# Patient Record
Sex: Female | Born: 1955 | Race: White | Hispanic: No | Smoking: Current every day smoker
Health system: Southern US, Community
[De-identification: ages and names within clinical notes are randomized; demographics above are authoritative.]

## PROBLEM LIST (undated history)

## (undated) DIAGNOSIS — J449 Chronic obstructive pulmonary disease, unspecified: Secondary | ICD-10-CM

## (undated) DIAGNOSIS — I639 Cerebral infarction, unspecified: Secondary | ICD-10-CM

## (undated) DIAGNOSIS — I10 Essential (primary) hypertension: Secondary | ICD-10-CM

## (undated) HISTORY — PX: TOTAL HIP ARTHROPLASTY: SHX124

## (undated) HISTORY — PX: OTHER SURGICAL HISTORY: SHX169

## (undated) HISTORY — PX: CHOLECYSTECTOMY: SHX55

## (undated) HISTORY — PX: ABDOMINAL HYSTERECTOMY: SHX81

---

## 2000-11-16 ENCOUNTER — Encounter: Payer: Self-pay | Admitting: Emergency Medicine

## 2000-11-16 ENCOUNTER — Emergency Department (HOSPITAL_COMMUNITY): Admission: EM | Admit: 2000-11-16 | Discharge: 2000-11-16 | Payer: Self-pay | Admitting: Emergency Medicine

## 2004-09-10 ENCOUNTER — Ambulatory Visit: Payer: Self-pay | Admitting: Family Medicine

## 2004-11-17 ENCOUNTER — Emergency Department: Payer: Self-pay | Admitting: Internal Medicine

## 2005-02-06 ENCOUNTER — Other Ambulatory Visit: Payer: Self-pay

## 2005-02-06 ENCOUNTER — Emergency Department: Payer: Self-pay | Admitting: Emergency Medicine

## 2006-01-07 ENCOUNTER — Ambulatory Visit: Payer: Self-pay | Admitting: Family Medicine

## 2006-03-31 ENCOUNTER — Ambulatory Visit: Payer: Self-pay | Admitting: Gastroenterology

## 2008-03-16 ENCOUNTER — Ambulatory Visit: Payer: Self-pay | Admitting: Family Medicine

## 2009-01-26 ENCOUNTER — Emergency Department: Payer: Self-pay | Admitting: Emergency Medicine

## 2009-02-02 ENCOUNTER — Ambulatory Visit: Payer: Self-pay | Admitting: Family Medicine

## 2009-03-19 ENCOUNTER — Ambulatory Visit: Payer: Self-pay | Admitting: Internal Medicine

## 2009-03-30 ENCOUNTER — Ambulatory Visit: Payer: Self-pay | Admitting: Anesthesiology

## 2009-04-12 ENCOUNTER — Ambulatory Visit: Payer: Self-pay | Admitting: Anesthesiology

## 2009-05-07 ENCOUNTER — Ambulatory Visit: Payer: Self-pay | Admitting: Cardiology

## 2009-05-07 ENCOUNTER — Ambulatory Visit: Payer: Self-pay | Admitting: Orthopedic Surgery

## 2009-05-14 ENCOUNTER — Inpatient Hospital Stay: Payer: Self-pay | Admitting: Orthopedic Surgery

## 2009-06-23 ENCOUNTER — Inpatient Hospital Stay: Payer: Self-pay | Admitting: Internal Medicine

## 2009-07-19 ENCOUNTER — Ambulatory Visit: Payer: Self-pay | Admitting: Internal Medicine

## 2009-07-23 ENCOUNTER — Ambulatory Visit: Payer: Self-pay | Admitting: Internal Medicine

## 2009-08-06 ENCOUNTER — Ambulatory Visit: Payer: Self-pay | Admitting: Vascular Surgery

## 2009-08-10 ENCOUNTER — Ambulatory Visit: Payer: Self-pay | Admitting: Vascular Surgery

## 2009-10-15 ENCOUNTER — Ambulatory Visit: Payer: Self-pay | Admitting: Orthopedic Surgery

## 2010-02-13 ENCOUNTER — Ambulatory Visit: Payer: Self-pay | Admitting: Internal Medicine

## 2010-02-26 ENCOUNTER — Ambulatory Visit: Payer: Self-pay | Admitting: Vascular Surgery

## 2010-02-27 ENCOUNTER — Ambulatory Visit: Payer: Self-pay | Admitting: Oncology

## 2010-03-09 ENCOUNTER — Inpatient Hospital Stay: Payer: Self-pay | Admitting: Specialist

## 2010-03-29 ENCOUNTER — Ambulatory Visit: Payer: Self-pay | Admitting: Oncology

## 2010-05-02 IMAGING — US US EXTREM LOW VENOUS BILAT
1 series · 17 of 24 positions shown · non-contrast
Comparison: none

REASON FOR EXAM: Bila Legs Pain Swelling Eval DVt Call Report [id number]
COMMENTS:

[Series 1: us extrem low venous bilat · 17 of 43 slices shown]
[im 1/43]
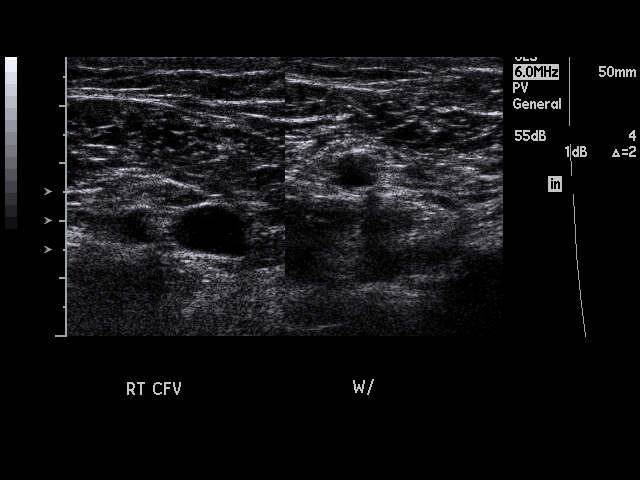
[im 4/43]
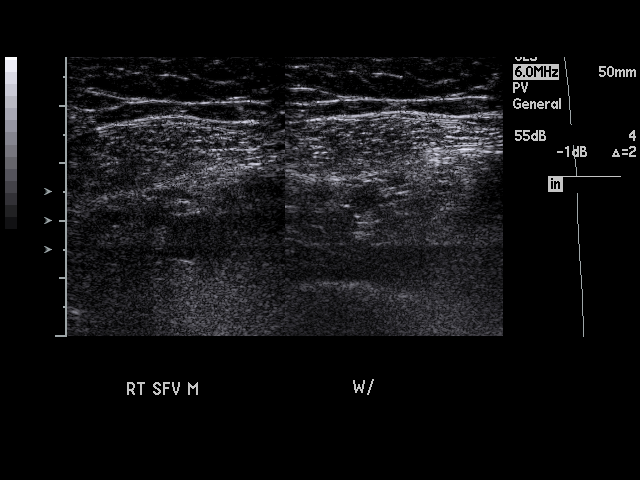
[im 6/43]
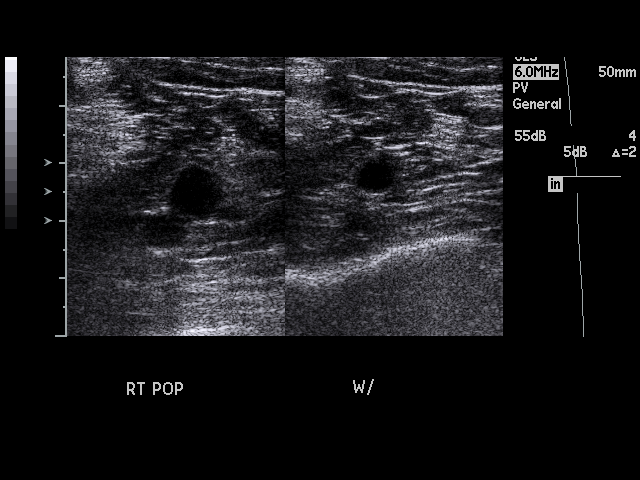
[im 8/43]
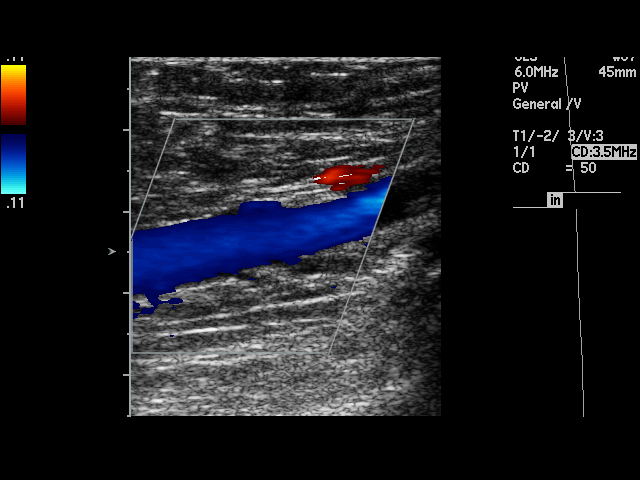
[im 11/43]
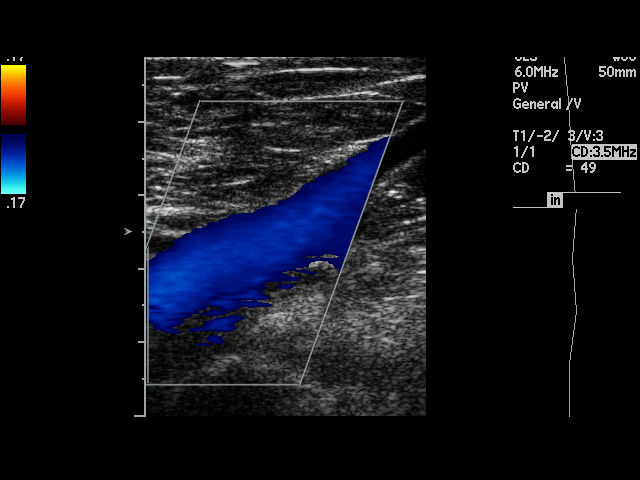
[im 13/43]
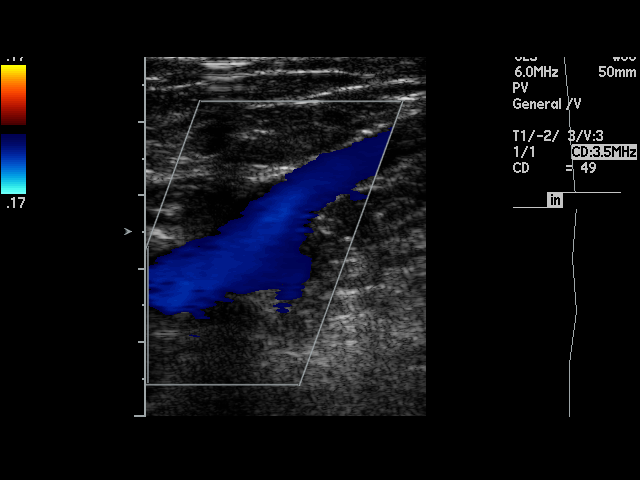
[im 17/43]
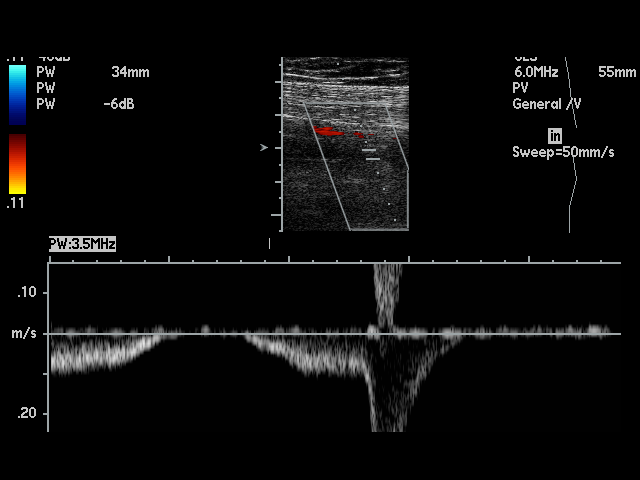
[im 19/43]
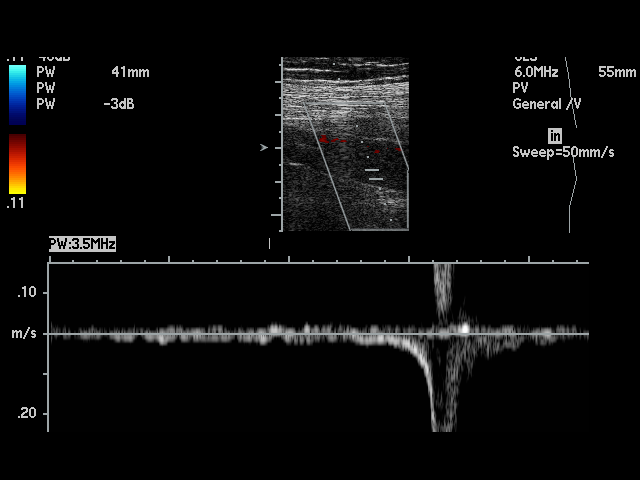
[im 22/43]
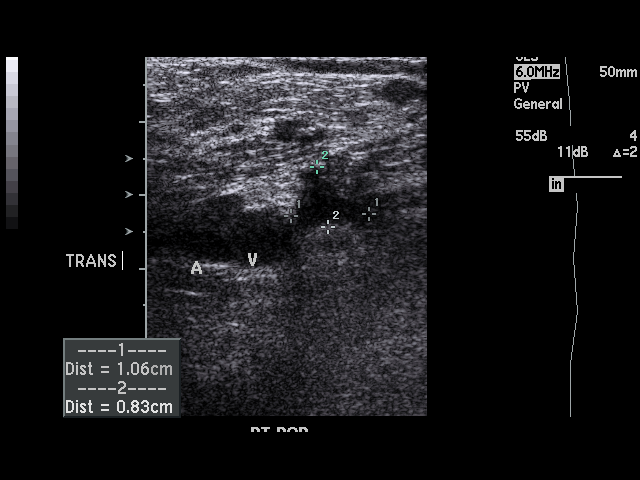
[im 24/43]
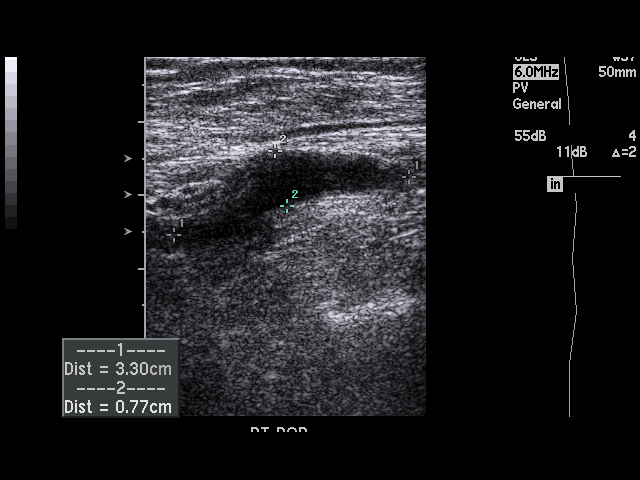
[im 26/43]
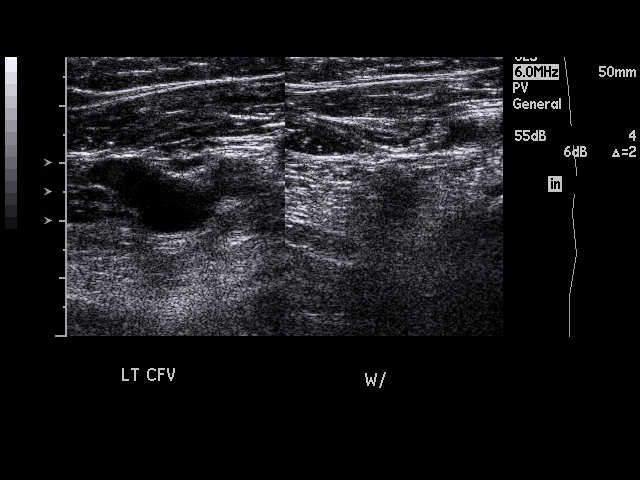
[im 30/43]
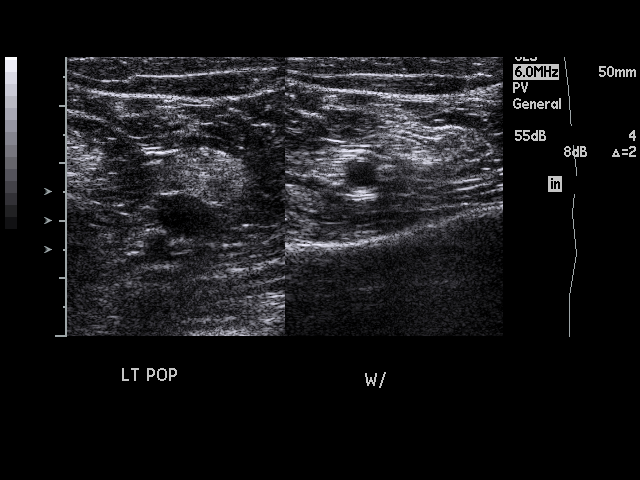
[im 32/43]
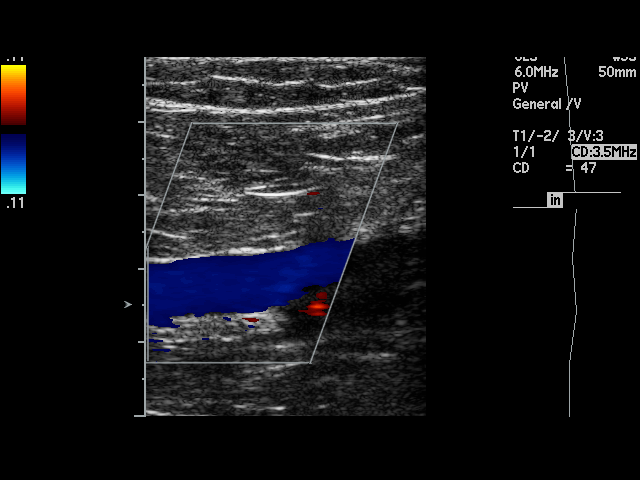
[im 35/43]
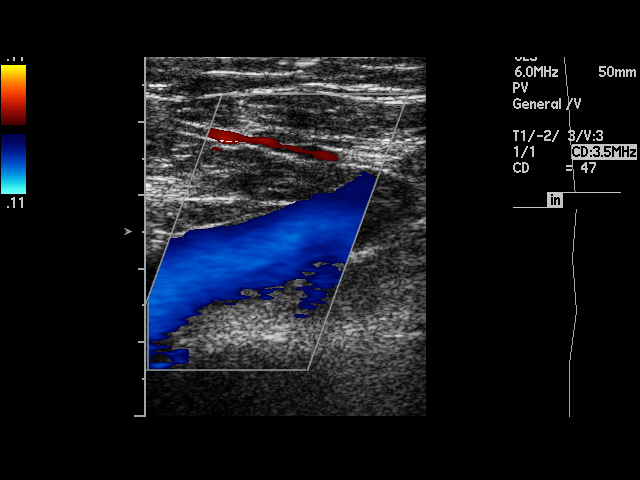
[im 37/43]
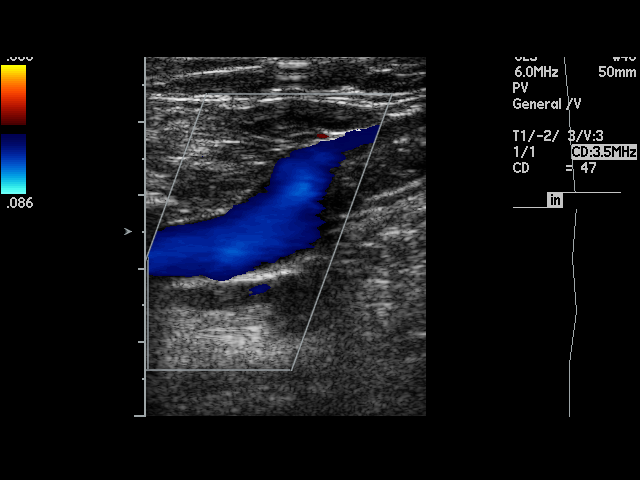
[im 39/43]
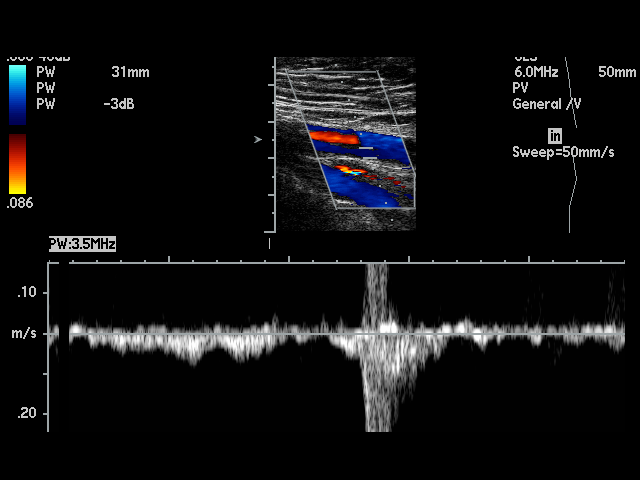
[im 43/43]
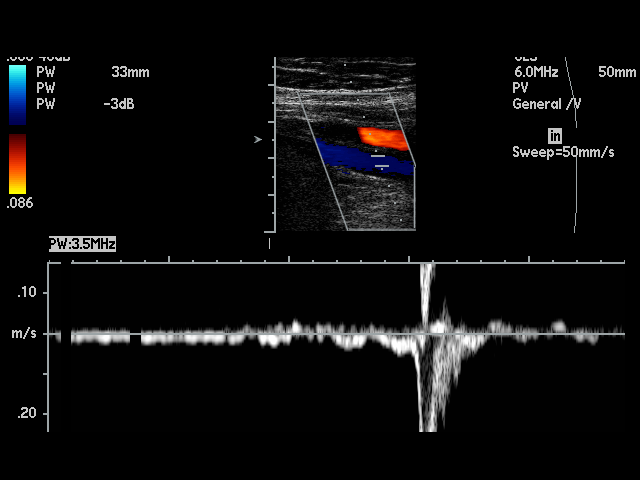

[17 of 24 positions shown; findings below may reference images not displayed]

PROCEDURE:     US  - US DOPPLER LOW EXTR BILATERAL  - July 19, 2009  [DATE]

RESULT:     The augmentation and flow waveforms are normal in appearance
bilaterally. The femoral and popliteal veins bilaterally show complete
compressibility throughout their course. Bilateral Doppler examination shows
no occlusion or evidence of deep vein thrombosis. At the left calf, there is
a fluid collection that measures 3.3 cm x 1.06 cm x 0.83 cm. The finding is
smaller than was evident on the exam of 06/27/2009.
IMPRESSION: 1.  No deep vein thrombosis is identified on either side.
2.  The previously noted fluid collection posteriorly at the left knee is
smaller than was evident on the prior exam.

## 2010-09-11 ENCOUNTER — Ambulatory Visit: Payer: Self-pay

## 2010-12-21 IMAGING — CT CT CHEST W/ CM
2 series · 16 of 32 positions shown, 20 images · non-contrast
Comparison: none

REASON FOR EXAM: sob
COMMENTS:

PROCEDURE:     CT  - CT CHEST (FOR PE) W  - March 09, 2010  [DATE]
RESULT:     History: Shorts of breath.
Comparison Study: Prior chest CT of 07/23/2009.

[Series 4: soft tissue · axial · 0.74mm/px · z∈[-547,-499]mm · 2 of 102 slices shown]
[im 8/102  mediastinal]
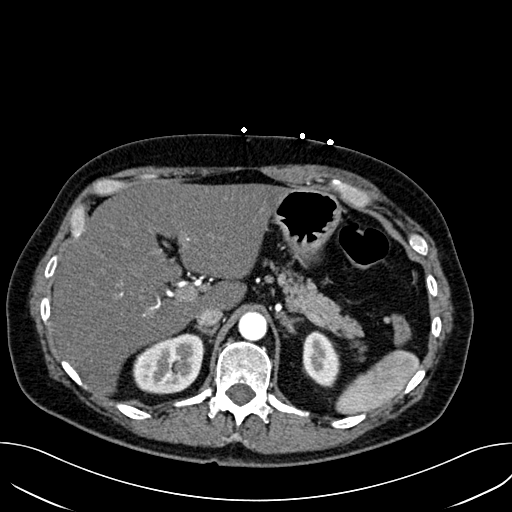
[im 24/102  mediastinal]
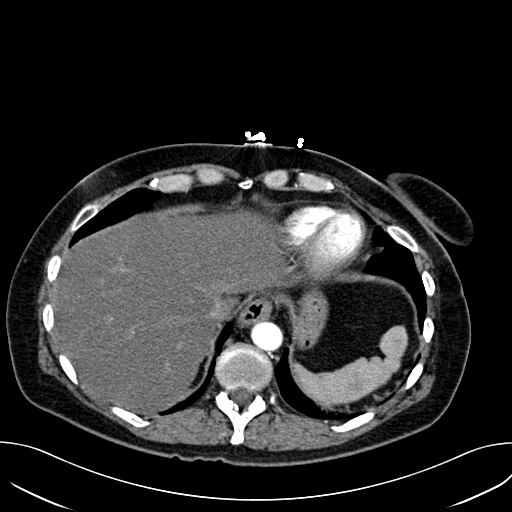

[Series 5: lung windows · axial · 0.74mm/px · z∈[-541,-289]mm · 14 of 100 slices shown, 18 images]
[im 8/100  mediastinal]
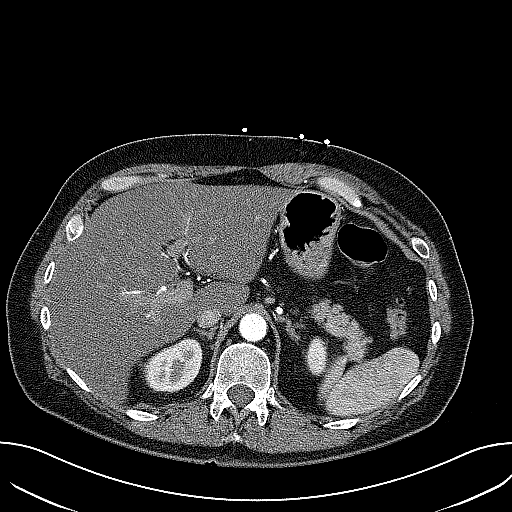
[im 8/100  lung]
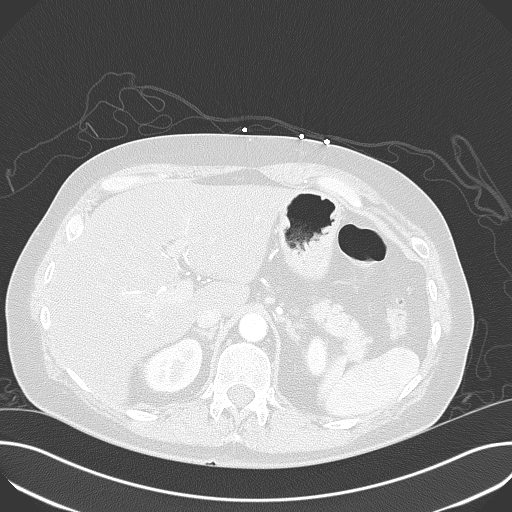
[im 16/100  lung]
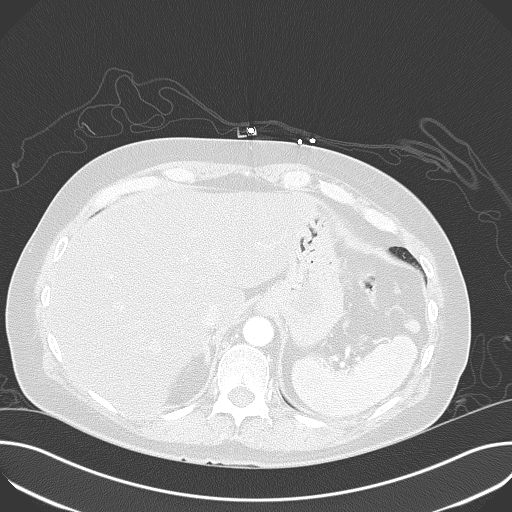
[im 23/100  lung]
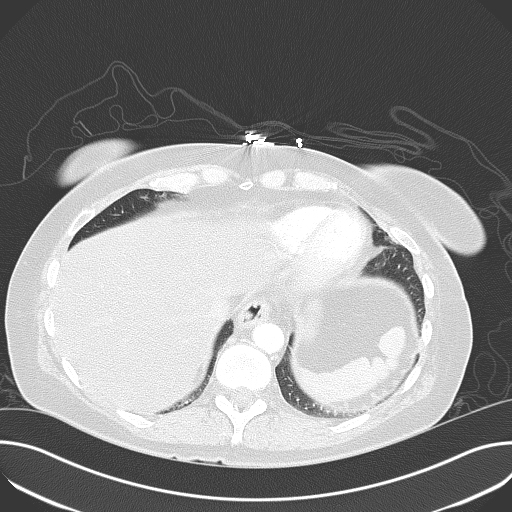
[im 31/100  lung]
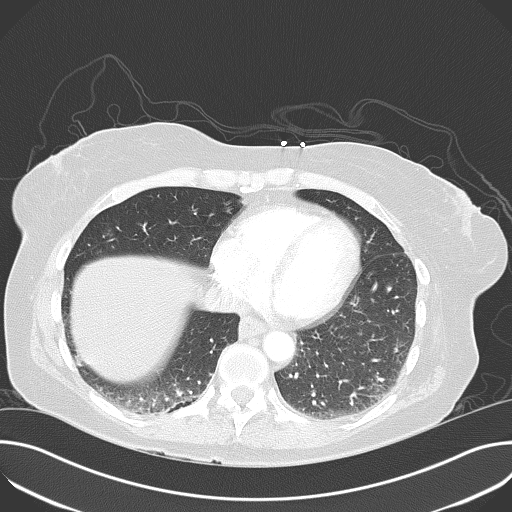
[im 39/100  mediastinal]
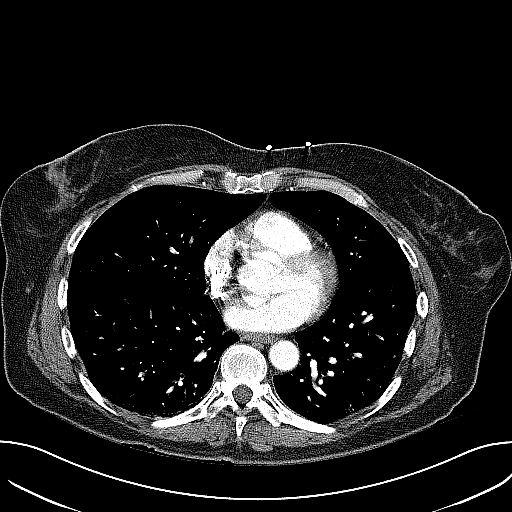
[im 39/100  lung]
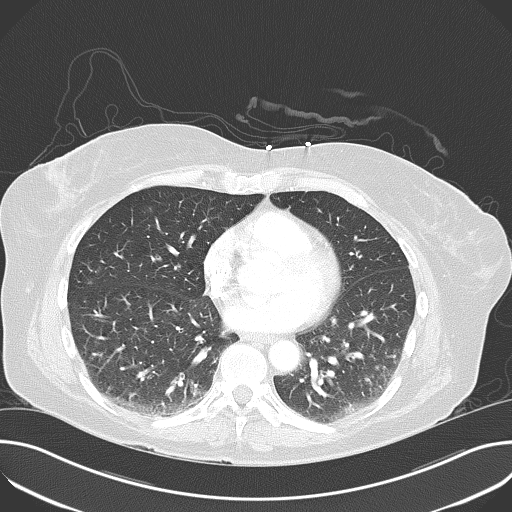
[im 46/100  lung]
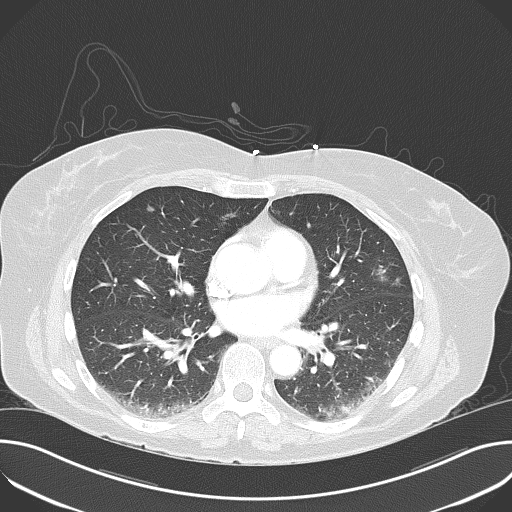
[im 47/100  lung]
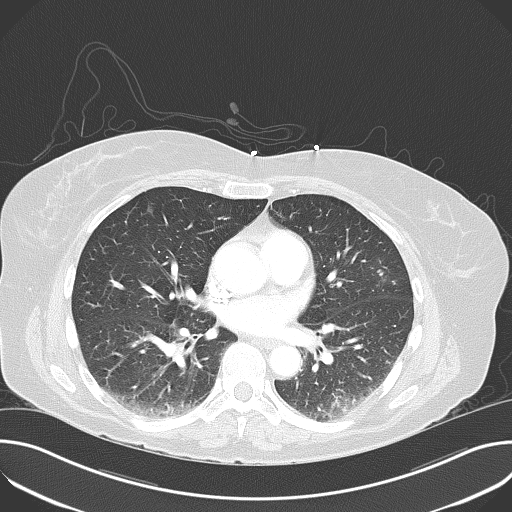
[im 50/100  lung]
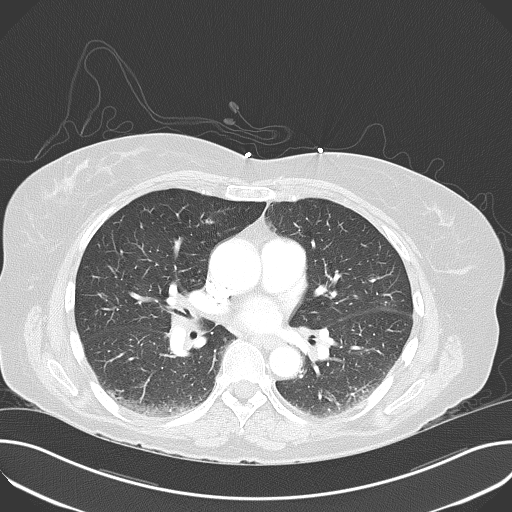
[im 54/100  mediastinal]
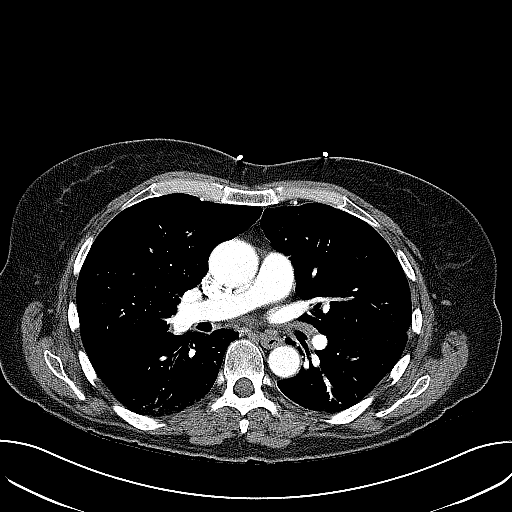
[im 54/100  lung]
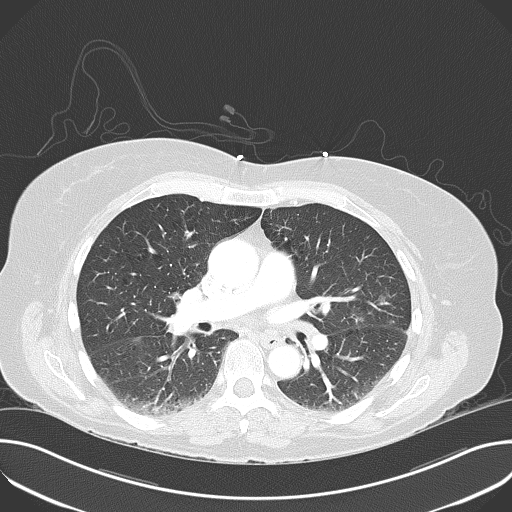
[im 61/100  lung]
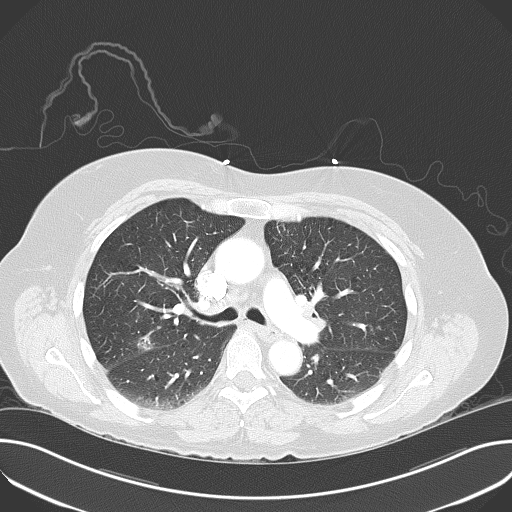
[im 69/100  lung]
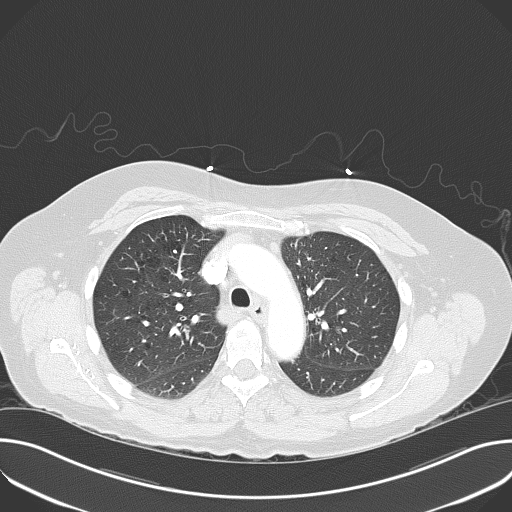
[im 77/100  lung]
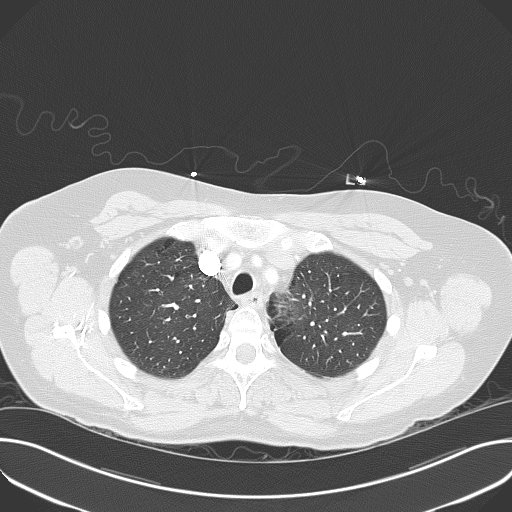
[im 84/100  mediastinal]
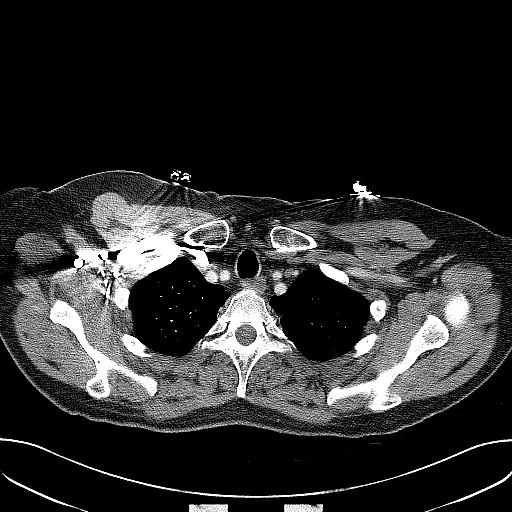
[im 84/100  lung]
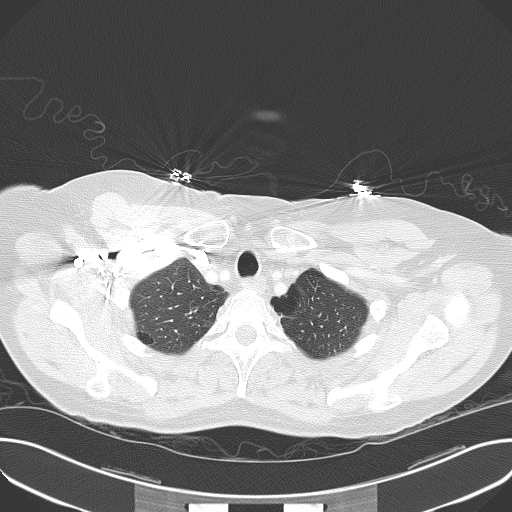
[im 92/100  lung]
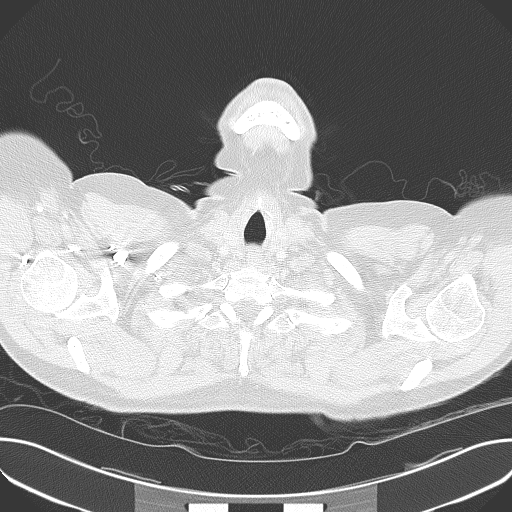

[16 of 32 positions shown; findings below may reference images not displayed]

FINDINGS: CT chest performed with 100 cc of [id number]. Small mediastinal
hilar and subcarinal lymph nodes are present. Right hilar lymph node
measures roughly 1.4 cm. New patchy bilateral basilar pulmonary infiltrates
are noted most consistent with pneumonia. Large airways are patent. Thoracic
aorta unremarkable. Pulmonary arteries are normal.
IMPRESSION: Findings consistent with bilateral mild pneumonia with
other findings as above. No evidence of pulmonary embolus. Followup chest CT
can be obtained to demonstrate resolution of the above described findings.

## 2010-12-21 IMAGING — CR DG CHEST 1V PORT
1 series · 1 of 1 positions shown · non-contrast
Comparison: none

REASON FOR EXAM: shortness of breath, cough
COMMENTS:   LMP: Post Hysterectomy

PROCEDURE:     DXR - DXR PORTABLE CHEST SINGLE VIEW  - March 09, 2010  [DATE]
RESULT:     No acute cardiopulmonary disease. Pleural effusions have cleared
from 06/26/2009.

[view not recorded]
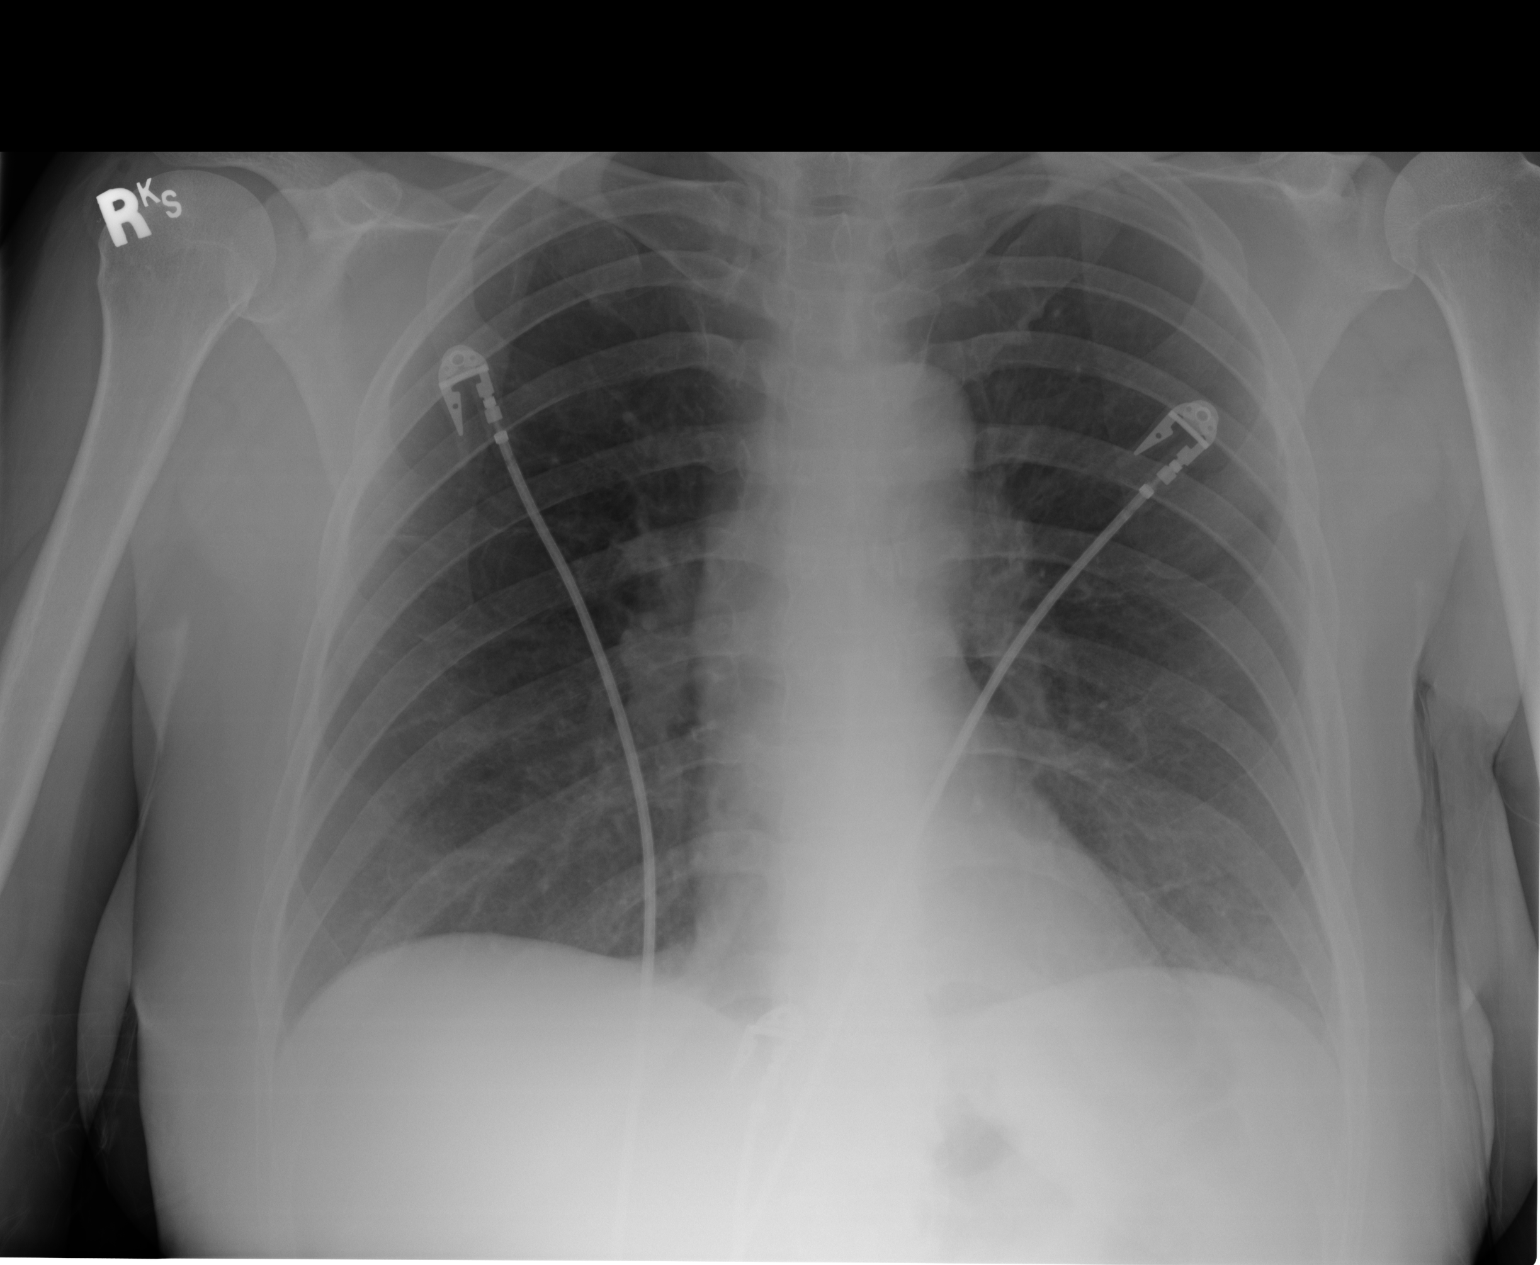

[1 of 1 positions shown; findings below may reference images not displayed]

IMPRESSION: No acute abnormality.

## 2011-05-01 ENCOUNTER — Ambulatory Visit: Payer: Self-pay | Admitting: "Endocrinology

## 2011-05-26 ENCOUNTER — Ambulatory Visit: Payer: Self-pay | Admitting: "Endocrinology

## 2011-05-31 ENCOUNTER — Ambulatory Visit: Payer: Self-pay | Admitting: "Endocrinology

## 2012-05-29 ENCOUNTER — Emergency Department (HOSPITAL_BASED_OUTPATIENT_CLINIC_OR_DEPARTMENT_OTHER): Payer: Medicare Other

## 2012-05-29 ENCOUNTER — Emergency Department (HOSPITAL_BASED_OUTPATIENT_CLINIC_OR_DEPARTMENT_OTHER)
Admission: EM | Admit: 2012-05-29 | Discharge: 2012-05-29 | Disposition: A | Discharge status: Home / Self Care | Payer: Medicare Other | Attending: Emergency Medicine | Admitting: Emergency Medicine

## 2012-05-29 ENCOUNTER — Encounter (HOSPITAL_BASED_OUTPATIENT_CLINIC_OR_DEPARTMENT_OTHER): Payer: Self-pay | Admitting: *Deleted

## 2012-05-29 DIAGNOSIS — R5381 Other malaise: Secondary | ICD-10-CM | POA: Insufficient documentation

## 2012-05-29 DIAGNOSIS — R160 Hepatomegaly, not elsewhere classified: Secondary | ICD-10-CM | POA: Insufficient documentation

## 2012-05-29 DIAGNOSIS — R05 Cough: Secondary | ICD-10-CM | POA: Insufficient documentation

## 2012-05-29 DIAGNOSIS — R112 Nausea with vomiting, unspecified: Secondary | ICD-10-CM | POA: Insufficient documentation

## 2012-05-29 DIAGNOSIS — R109 Unspecified abdominal pain: Secondary | ICD-10-CM | POA: Insufficient documentation

## 2012-05-29 DIAGNOSIS — Z9089 Acquired absence of other organs: Secondary | ICD-10-CM | POA: Insufficient documentation

## 2012-05-29 DIAGNOSIS — R059 Cough, unspecified: Secondary | ICD-10-CM | POA: Insufficient documentation

## 2012-05-29 DIAGNOSIS — K7689 Other specified diseases of liver: Secondary | ICD-10-CM | POA: Insufficient documentation

## 2012-05-29 LAB — CBC WITH DIFFERENTIAL/PLATELET
Basophils Absolute: 0 10*3/uL (ref 0.0–0.1)
Basophils Relative: 0 % (ref 0–1)
HCT: 43.6 % (ref 36.0–46.0)
Hemoglobin: 14.6 g/dL (ref 12.0–15.0)
Lymphocytes Relative: 39 % (ref 12–46)
MCHC: 33.5 g/dL (ref 30.0–36.0)
Monocytes Relative: 5 % (ref 3–12)
Neutro Abs: 6.7 10*3/uL (ref 1.7–7.7)
Neutrophils Relative %: 55 % (ref 43–77)
WBC: 12.3 10*3/uL — ABNORMAL HIGH (ref 4.0–10.5)

## 2012-05-29 LAB — URINALYSIS, ROUTINE W REFLEX MICROSCOPIC
Leukocytes, UA: NEGATIVE
Protein, ur: NEGATIVE mg/dL
Specific Gravity, Urine: 1.01 (ref 1.005–1.030)
Urobilinogen, UA: 0.2 mg/dL (ref 0.0–1.0)

## 2012-05-29 LAB — COMPREHENSIVE METABOLIC PANEL
ALT: 62 U/L — ABNORMAL HIGH (ref 0–35)
BUN: 9 mg/dL (ref 6–23)
CO2: 27 mEq/L (ref 19–32)
Calcium: 10.1 mg/dL (ref 8.4–10.5)
Creatinine, Ser: 0.6 mg/dL (ref 0.50–1.10)
GFR calc Af Amer: >90 mL/min (ref >90)
GFR calc non Af Amer: >90 mL/min (ref >90)
Glucose, Bld: 110 mg/dL — ABNORMAL HIGH (ref 70–99)
Sodium: 138 mEq/L (ref 135–145)
Total Protein: 7.8 g/dL (ref 6.0–8.3)

## 2012-05-29 MED ORDER — IOHEXOL 300 MG/ML  SOLN
40.0000 mL | Freq: Once | INTRAMUSCULAR | Status: AC | PRN
  Administered 2012-05-29: 40 mL via ORAL

## 2012-05-29 MED ORDER — MORPHINE SULFATE 4 MG/ML IJ SOLN
4.0000 mg | Freq: Once | INTRAMUSCULAR | Status: AC
  Administered 2012-05-29: 4 mg via INTRAVENOUS
  Filled 2012-05-29: qty 1

## 2012-05-29 MED ORDER — ONDANSETRON HCL 4 MG/2ML IJ SOLN
4.0000 mg | Freq: Once | INTRAMUSCULAR | Status: AC
  Administered 2012-05-29: 4 mg via INTRAVENOUS
  Filled 2012-05-29: qty 2

## 2012-05-29 MED ORDER — HYDROMORPHONE HCL PF 1 MG/ML IJ SOLN
1.0000 mg | Freq: Once | INTRAMUSCULAR | Status: AC
  Administered 2012-05-29: 1 mg via INTRAVENOUS
  Filled 2012-05-29: qty 1

## 2012-05-29 MED ORDER — TOBRAMYCIN-DEXAMETHASONE 0.3-0.1 % OP SUSP: 2.0000 [drp] | Freq: Three times a day (TID) | OPHTHALMIC | Status: DC

## 2012-05-29 MED ORDER — PROMETHAZINE HCL 25 MG/ML IJ SOLN
INTRAMUSCULAR | Status: AC
  Administered 2012-05-29: 12.5 mg via INTRAVENOUS
  Filled 2012-05-29: qty 1

## 2012-05-29 MED ORDER — PROMETHAZINE HCL 25 MG/ML IJ SOLN
12.5000 mg | Freq: Once | INTRAMUSCULAR | Status: AC
  Administered 2012-05-29: 12.5 mg via INTRAVENOUS

## 2012-05-29 MED ORDER — SODIUM CHLORIDE 0.9 % IV BOLUS (SEPSIS): 1000.0000 mL | Freq: Once | INTRAVENOUS | Status: DC

## 2012-05-29 MED ORDER — SODIUM CHLORIDE 0.9 % IV BOLUS (SEPSIS)
1000.0000 mL | Freq: Once | INTRAVENOUS | Status: AC
  Administered 2012-05-29: 1000 mL via INTRAVENOUS

## 2012-05-29 MED ORDER — ONDANSETRON 8 MG PO TBDP: 8.0000 mg | ORAL_TABLET | Freq: Three times a day (TID) | ORAL | Status: AC | PRN

## 2012-05-29 MED ORDER — IOHEXOL 300 MG/ML  SOLN
100.0000 mL | Freq: Once | INTRAMUSCULAR | Status: AC | PRN
  Administered 2012-05-29: 100 mL via INTRAVENOUS

## 2012-05-29 NOTE — ED Notes (Signed)
Pt called out to RN station requesting nausea medicine.  Per Dr Alto [place] order rec'd dfor phenergan 12.5mg . Upon entering room , pt was not present.  Will check back soon.

## 2012-05-29 NOTE — ED Notes (Signed)
Pt states she has been coughing up white foamy emesis since last night which makes her gag and throw up.

## 2012-05-30 NOTE — ED Provider Notes (Signed)
History     CSN: [id number]  Arrival date & time 05/29/12  1544   First MD Initiated Contact with Patient 05/29/12 1629      Chief Complaint  Patient presents with  . Cough    (Consider location/radiation/quality/duration/timing/severity/associated sxs/prior treatment) HPI Patient is a 56 year old female who presents today complaining of cough as well as diffuse abdominal pain with nausea, one episode of vomiting, and constipation. Patient felt that her symptoms began after using a glycerin suppository last night. Patient reports that she feels that she continues to smell the suppository. Patient denies any history of obstruction. She is on chronic pain medications. Patient denies any chest pain or shortness of breath. She reports having generalized malaise and fatigue. She denies any recent congestion or sore throat. Patient has history of cholecystectomy. As well as abdominal hysterectomy. She denies any dysuria. There no other associated or modifying factors. History reviewed. No pertinent past medical history.  Past Surgical History  Procedure Date  . Total hip arthroplasty   . Collapsed lung   . Cholecystectomy   . Abdominal hysterectomy     History reviewed. No pertinent family history.  History  Substance Use Topics  . Smoking status: Current Everyday Smoker  . Smokeless tobacco: Not on file  . Alcohol Use: No    OB History    Grav Para Term Preterm Abortions TAB SAB Ect Mult Living                  Review of Systems  Constitutional: Positive for appetite change and fatigue.  HENT: Negative.   Eyes: Negative.   Respiratory: Positive for cough.   Cardiovascular: Negative.   Gastrointestinal: Positive for nausea, vomiting and abdominal pain.  Genitourinary: Negative.   Musculoskeletal: Positive for back pain.  Skin: Negative.   Neurological: Negative.   Hematological: Negative.   Psychiatric/Behavioral: Negative.   All other systems reviewed and are  negative.    Allergies  Other  Home Medications   Current Outpatient Rx  Name Route Sig Dispense Refill  . ALPRAZOLAM 0.5 MG PO TABS Oral Take 0.5 mg by mouth at bedtime as needed.    Marland Kitchen AMITRIPTYLINE HCL 25 MG PO TABS Oral Take 25 mg by mouth at bedtime.    . BUPROPION HCL ER (SMOKING DET) 150 MG PO TB12 Oral Take 150 mg by mouth 2 (two) times daily.    . CYCLOBENZAPRINE HCL 10 MG PO TABS Oral Take 10 mg by mouth 3 (three) times daily as needed.    Marland Kitchen ESCITALOPRAM OXALATE 20 MG PO TABS Oral Take 20 mg by mouth daily.    Marland Kitchen GABAPENTIN 300 MG PO CAPS Oral Take 300 mg by mouth 3 (three) times daily.    Marland Kitchen GLYCERIN (LAXATIVE) 2 G RE SUPP Rectal Place 1 suppository rectally once as needed. For constipation.    . OMEGA-3-ACID ETHYL ESTERS 1 G PO CAPS Oral Take 2 g by mouth 2 (two) times daily.    Marland Kitchen RANITIDINE HCL 150 MG PO TABS Oral Take 150 mg by mouth 2 (two) times daily.    Marland Kitchen ONDANSETRON 8 MG PO TBDP Oral Take 1 tablet (8 mg total) by mouth every 8 (eight) hours as needed for nausea. 20 tablet 0    BP 105/74  Pulse 96  Temp 98 F (36.7 C) (Oral)  Resp 20  Ht 5\' 9"  (1.753 m)  Wt 198 lb (89.812 kg)  BMI 29.24 kg/m2  SpO2 97%  Physical Exam  Nursing note and  vitals reviewed. GEN: Well-developed, well-nourished female in no distress HEENT: Atraumatic, normocephalic. Oropharynx clear without erythema EYES: PERRLA BL, no scleral icterus. NECK: Trachea midline, no meningismus CV: regular rate and rhythm. No murmurs, rubs, or gallops PULM: No respiratory distress.  No crackles, wheezes, or rales. GI: soft, diffuse tenderness to palpation. No guarding, rebound. + bowel sounds  GU: deferred Neuro: cranial nerves grossly 2-12 intact, no abnormalities of strength or sensation, A and O x 3 MSK: Patient moves all 4 extremities symmetrically, no deformity, edema, or injury noted Skin: No rashes petechiae, purpura, or jaundice Psych: no abnormality of mood   ED Course  Procedures  (including critical care time)  Labs Reviewed  COMPREHENSIVE METABOLIC PANEL - Abnormal; Notable for the following:    Glucose, Bld 110 (*)     AST 44 (*)     ALT 62 (*)     Alkaline Phosphatase 133 (*)     All other components within normal limits  CBC WITH DIFFERENTIAL - Abnormal; Notable for the following:    WBC 12.3 (*)     RDW 18.5 (*)     Lymphs Abs 4.7 (*)     All other components within normal limits  LIPASE, BLOOD  URINALYSIS, ROUTINE W REFLEX MICROSCOPIC  LACTIC ACID, PLASMA  URINE CULTURE   Ct Abdomen Pelvis W Contrast  05/29/2012  *RADIOLOGY REPORT*  Clinical Data: Diffuse abdominal pain and emesis.  CT ABDOMEN AND PELVIS WITH CONTRAST  Technique:  Multidetector CT imaging of the abdomen and pelvis was performed following the standard protocol during bolus administration of intravenous contrast.  Contrast: 40mL OMNIPAQUE IOHEXOL 300 MG/ML  SOLN, OMNIPAQUE IOHEXOL 300 MG/ML  SOLN  Comparison: Abdominal films earlier today.  Findings: There is evidence of hepatomegaly and marked hepatic steatosis.  Maximal liver dimensions are approximately 25 cm. The common bile duct has a maximal diameter of approximately 15 mm. The patient has had prior cholecystectomy.  No definite calculi are identified within the bile ducts.  The pancreas is normal and shows no evidence of mass or pancreatitis. The spleen is not enlarged.  There is no evidence of ascites.  Bowel loops show no evidence of obstruction or inflammation.  No masses or enlarged lymph nodes are seen.  The kidneys and adrenal glands are within normal limits.  Portions of the pelvis are obscured by streak artifact from bilateral hip prosthesis.  No hernias are identified.  Mild degenerative changes are present in the visualized spine.  IMPRESSION:  1.  Hepatomegaly with marked hepatic steatosis. 2.  Common bile duct dilatation after prior cholecystectomy with maximal CBD diameter of 15 mm.  No cause of obstruction is identified.   Correlation suggested with liver function tests.   Original Report Authenticated By: Reola Calkins, M.D.    Dg Abd Acute W/chest  05/29/2012  *RADIOLOGY REPORT*  Clinical Data: Lower abdominal pain.  Nausea.  Diarrhea.  ACUTE ABDOMEN SERIES (ABDOMEN 2 VIEW & CHEST 1 VIEW)  Comparison: None.  Findings: Bowel gas pattern unremarkable without evidence of obstruction or significant ileus.  No evidence of free air or significant air fluid levels on the erect image.  Phleboliths in the pelvis.  No opaque urinary tract calculi.  Mild degenerative changes involving the lumbar spine.  Bilateral hip arthroplasties with anatomic alignment.  Cardiac silhouette normal in size.  Thoracic aorta mildly atherosclerotic.  Hilar and mediastinal contours otherwise unremarkable.  Linear scar favored over atelectasis in the right upper lobe.  Lungs otherwise clear.  IMPRESSION:  1.  No acute abdominal abnormality. 2.  Linear scarring in the right upper lobe.  No acute cardiopulmonary disease.   Original Report Authenticated By: Arnell Sieving, M.D.      1. Abdominal pain       MDM  Patient was evaluated by myself. She complained of cough as well as abdominal pain, malaise, nausea, and vomiting. Patient did also mention dysuria as well. Urinalysis was completely within normal limits. Given degree of patient's symptoms she did have a lactate which was unremarkable. Patient had slight leukocytosis of 12.3. She had slight elevation of her liver function was normal lipase. Given report of constipation as well as cough patient did have an acute abdominal series which was unremarkable. CT of the abdomen and pelvis was ordered after symptoms persisted. This was remarkable only for common bile duct dilation following prior cholecystectomy. Liver function was not significantly elevated. Patient was notified of results and told to followup on these with her primary care physician. She was able to tolerate by mouth here. Patient  is followed by pain clinic and was not prescribe pain medication for this reason. She was given a prescription for Zofran and was discharged in good condition. Patient family were told that if symptoms change or worsen that she was welcome to return for further evaluation as it was possible that she could have very early intra-abdominal process that was not picked up today.        Cyndra Numbers, MD 05/30/12 [phone]

## 2012-05-31 LAB — URINE CULTURE

## 2016-07-09 ENCOUNTER — Emergency Department (HOSPITAL_COMMUNITY): Payer: Medicare Other

## 2016-07-09 ENCOUNTER — Emergency Department (HOSPITAL_COMMUNITY)
Admission: EM | Admit: 2016-07-09 | Discharge: 2016-07-09 | Disposition: A | Discharge status: Home / Self Care | Payer: Medicare Other | Attending: Emergency Medicine | Admitting: Emergency Medicine

## 2016-07-09 ENCOUNTER — Encounter (HOSPITAL_COMMUNITY): Payer: Self-pay | Admitting: Radiology

## 2016-07-09 DIAGNOSIS — R4701 Aphasia: Secondary | ICD-10-CM

## 2016-07-09 DIAGNOSIS — R930 Abnormal findings on diagnostic imaging of skull and head, not elsewhere classified: Secondary | ICD-10-CM | POA: Diagnosis not present

## 2016-07-09 DIAGNOSIS — S22008G Other fracture of unspecified thoracic vertebra, subsequent encounter for fracture with delayed healing: Secondary | ICD-10-CM | POA: Insufficient documentation

## 2016-07-09 DIAGNOSIS — E876 Hypokalemia: Secondary | ICD-10-CM | POA: Diagnosis not present

## 2016-07-09 DIAGNOSIS — I10 Essential (primary) hypertension: Secondary | ICD-10-CM | POA: Insufficient documentation

## 2016-07-09 DIAGNOSIS — Z5181 Encounter for therapeutic drug level monitoring: Secondary | ICD-10-CM | POA: Insufficient documentation

## 2016-07-09 DIAGNOSIS — Z8673 Personal history of transient ischemic attack (TIA), and cerebral infarction without residual deficits: Secondary | ICD-10-CM | POA: Insufficient documentation

## 2016-07-09 DIAGNOSIS — M545 Low back pain, unspecified: Secondary | ICD-10-CM

## 2016-07-09 DIAGNOSIS — F10920 Alcohol use, unspecified with intoxication, uncomplicated: Secondary | ICD-10-CM

## 2016-07-09 DIAGNOSIS — F17200 Nicotine dependence, unspecified, uncomplicated: Secondary | ICD-10-CM | POA: Diagnosis not present

## 2016-07-09 DIAGNOSIS — Z79899 Other long term (current) drug therapy: Secondary | ICD-10-CM | POA: Insufficient documentation

## 2016-07-09 DIAGNOSIS — G9340 Encephalopathy, unspecified: Secondary | ICD-10-CM

## 2016-07-09 DIAGNOSIS — Z96649 Presence of unspecified artificial hip joint: Secondary | ICD-10-CM | POA: Diagnosis not present

## 2016-07-09 DIAGNOSIS — F10220 Alcohol dependence with intoxication, uncomplicated: Secondary | ICD-10-CM | POA: Insufficient documentation

## 2016-07-09 DIAGNOSIS — W19XXXD Unspecified fall, subsequent encounter: Secondary | ICD-10-CM | POA: Diagnosis not present

## 2016-07-09 DIAGNOSIS — J449 Chronic obstructive pulmonary disease, unspecified: Secondary | ICD-10-CM | POA: Insufficient documentation

## 2016-07-09 DIAGNOSIS — S299XXD Unspecified injury of thorax, subsequent encounter: Secondary | ICD-10-CM | POA: Diagnosis present

## 2016-07-09 DIAGNOSIS — S22000G Wedge compression fracture of unspecified thoracic vertebra, subsequent encounter for fracture with delayed healing: Secondary | ICD-10-CM

## 2016-07-09 HISTORY — DX: Chronic obstructive pulmonary disease, unspecified: J44.9

## 2016-07-09 HISTORY — DX: Essential (primary) hypertension: I10

## 2016-07-09 HISTORY — DX: Cerebral infarction, unspecified: I63.9

## 2016-07-09 LAB — COMPREHENSIVE METABOLIC PANEL
ALBUMIN: 4 g/dL (ref 3.5–5.0)
ALK PHOS: 174 U/L — AB (ref 38–126)
ALT: 53 U/L (ref 14–54)
AST: 65 U/L — AB (ref 15–41)
Anion gap: 15 (ref 5–15)
BILIRUBIN TOTAL: 0.7 mg/dL (ref 0.3–1.2)
BUN: 9 mg/dL (ref 6–20)
CO2: 25 mmol/L (ref 22–32)
Calcium: 9.1 mg/dL (ref 8.9–10.3)
Chloride: 93 mmol/L — ABNORMAL LOW (ref 101–111)
Creatinine, Ser: 0.58 mg/dL (ref 0.44–1.00)
GFR calc Af Amer: >60 mL/min (ref >60)
GFR calc non Af Amer: >60 mL/min (ref >60)
GLUCOSE: 142 mg/dL — AB (ref 65–99)
POTASSIUM: 2.9 mmol/L — AB (ref 3.5–5.1)
SODIUM: 133 mmol/L — AB (ref 135–145)
TOTAL PROTEIN: 6.7 g/dL (ref 6.5–8.1)

## 2016-07-09 LAB — I-STAT TROPONIN, ED: Troponin i, poc: 0 ng/mL (ref 0.00–0.08)

## 2016-07-09 LAB — CBC
HCT: 42.7 % (ref 36.0–46.0)
HEMOGLOBIN: 14.9 g/dL (ref 12.0–15.0)
MCH: 35.1 pg — ABNORMAL HIGH (ref 26.0–34.0)
MCHC: 34.9 g/dL (ref 30.0–36.0)
MCV: 100.5 fL — ABNORMAL HIGH (ref 78.0–100.0)
PLATELETS: 163 10*3/uL (ref 150–400)
RBC: 4.25 MIL/uL (ref 3.87–5.11)
RDW: 14.7 % (ref 11.5–15.5)
WBC: 8.7 10*3/uL (ref 4.0–10.5)

## 2016-07-09 LAB — CBG MONITORING, ED: Glucose-Capillary: 153 mg/dL — ABNORMAL HIGH (ref 65–99)

## 2016-07-09 LAB — I-STAT CHEM 8, ED
BUN: 9 mg/dL (ref 6–20)
CREATININE: 0.8 mg/dL (ref 0.44–1.00)
Calcium, Ion: 1.02 mmol/L — ABNORMAL LOW (ref 1.15–1.40)
Chloride: 92 mmol/L — ABNORMAL LOW (ref 101–111)
GLUCOSE: 145 mg/dL — AB (ref 65–99)
HCT: 47 % — ABNORMAL HIGH (ref 36.0–46.0)
HEMOGLOBIN: 16 g/dL — AB (ref 12.0–15.0)
Potassium: 2.9 mmol/L — ABNORMAL LOW (ref 3.5–5.1)
Sodium: 134 mmol/L — ABNORMAL LOW (ref 135–145)
TCO2: 24 mmol/L (ref 0–100)

## 2016-07-09 LAB — URINALYSIS, ROUTINE W REFLEX MICROSCOPIC
BILIRUBIN URINE: NEGATIVE
GLUCOSE, UA: NEGATIVE mg/dL
HGB URINE DIPSTICK: NEGATIVE
KETONES UR: 15 mg/dL — AB
LEUKOCYTES UA: NEGATIVE
Nitrite: POSITIVE — AB
PH: 6 (ref 5.0–8.0)
PROTEIN: NEGATIVE mg/dL
Specific Gravity, Urine: 1.02 (ref 1.005–1.030)

## 2016-07-09 LAB — PROTIME-INR
INR: 0.9
PROTHROMBIN TIME: 12.1 s (ref 11.4–15.2)

## 2016-07-09 LAB — DIFFERENTIAL
BASOS ABS: 0 10*3/uL (ref 0.0–0.1)
Basophils Relative: 1 %
EOS ABS: 0.1 10*3/uL (ref 0.0–0.7)
Eosinophils Relative: 1 %
LYMPHS ABS: 5.9 10*3/uL — AB (ref 0.7–4.0)
LYMPHS PCT: 67 %
Monocytes Absolute: 0.7 10*3/uL (ref 0.1–1.0)
Monocytes Relative: 8 %
NEUTROS PCT: 23 %
Neutro Abs: 2 10*3/uL (ref 1.7–7.7)

## 2016-07-09 LAB — RAPID URINE DRUG SCREEN, HOSP PERFORMED
AMPHETAMINES: NOT DETECTED
BARBITURATES: NOT DETECTED
Benzodiazepines: POSITIVE — AB
COCAINE: NOT DETECTED
OPIATES: NOT DETECTED
TETRAHYDROCANNABINOL: NOT DETECTED

## 2016-07-09 LAB — URINE MICROSCOPIC-ADD ON

## 2016-07-09 LAB — ETHANOL: Alcohol, Ethyl (B): 252 mg/dL — ABNORMAL HIGH (ref <5)

## 2016-07-09 LAB — APTT: APTT: 26 s (ref 24–36)

## 2016-07-09 MED ORDER — PROMETHAZINE HCL 25 MG/ML IJ SOLN
12.5000 mg | Freq: Once | INTRAMUSCULAR | Status: AC
  Administered 2016-07-09: 12.5 mg via INTRAVENOUS
  Filled 2016-07-09: qty 1

## 2016-07-09 MED ORDER — GADOBENATE DIMEGLUMINE 529 MG/ML IV SOLN
15.0000 mL | Freq: Once | INTRAVENOUS | Status: AC
  Administered 2016-07-09: 15 mL via INTRAVENOUS

## 2016-07-09 MED ORDER — MORPHINE SULFATE (PF) 4 MG/ML IV SOLN
4.0000 mg | INTRAVENOUS | Status: AC | PRN
  Administered 2016-07-09 (×3): 4 mg via INTRAVENOUS
  Filled 2016-07-09 (×3): qty 1

## 2016-07-09 MED ORDER — MAGNESIUM OXIDE 400 (241.3 MG) MG PO TABS
400.0000 mg | ORAL_TABLET | Freq: Once | ORAL | Status: AC
  Administered 2016-07-09: 400 mg via ORAL
  Filled 2016-07-09: qty 1

## 2016-07-09 MED ORDER — DIPHENHYDRAMINE HCL 50 MG/ML IJ SOLN
12.5000 mg | Freq: Once | INTRAMUSCULAR | Status: AC
  Administered 2016-07-09: 12.5 mg via INTRAVENOUS
  Filled 2016-07-09: qty 1

## 2016-07-09 MED ORDER — OXYCODONE-ACETAMINOPHEN 5-325 MG PO TABS: 2.0000 | ORAL_TABLET | ORAL | 0 refills | Status: AC | PRN

## 2016-07-09 MED ORDER — DOCUSATE SODIUM 100 MG PO CAPS: 100.0000 mg | ORAL_CAPSULE | Freq: Two times a day (BID) | ORAL | 0 refills | Status: AC

## 2016-07-09 MED ORDER — ONDANSETRON HCL 4 MG/2ML IJ SOLN
4.0000 mg | Freq: Once | INTRAMUSCULAR | Status: AC
  Administered 2016-07-09: 4 mg via INTRAVENOUS
  Filled 2016-07-09: qty 2

## 2016-07-09 MED ORDER — ONDANSETRON 4 MG PO TBDP: 4.0000 mg | ORAL_TABLET | Freq: Three times a day (TID) | ORAL | 0 refills | Status: AC | PRN

## 2016-07-09 MED ORDER — SCOPOLAMINE 1 MG/3DAYS TD PT72
1.0000 | MEDICATED_PATCH | TRANSDERMAL | Status: DC
  Administered 2016-07-09: 1.5 mg via TRANSDERMAL
  Filled 2016-07-09: qty 1

## 2016-07-09 MED ORDER — PROMETHAZINE HCL 25 MG/ML IJ SOLN
12.5000 mg | Freq: Once | INTRAMUSCULAR | Status: DC
  Filled 2016-07-09: qty 1

## 2016-07-09 MED ORDER — VITAMIN B-1 100 MG PO TABS
100.0000 mg | ORAL_TABLET | Freq: Once | ORAL | Status: AC
  Administered 2016-07-09: 100 mg via ORAL
  Filled 2016-07-09: qty 1

## 2016-07-09 MED ORDER — POTASSIUM CHLORIDE CRYS ER 20 MEQ PO TBCR
40.0000 meq | EXTENDED_RELEASE_TABLET | Freq: Once | ORAL | Status: AC
  Administered 2016-07-09: 40 meq via ORAL
  Filled 2016-07-09: qty 2

## 2016-07-09 MED ORDER — MIDAZOLAM HCL 2 MG/2ML IJ SOLN
1.0000 mg | Freq: Once | INTRAMUSCULAR | Status: AC
  Administered 2016-07-09: 1 mg via INTRAVENOUS
  Filled 2016-07-09: qty 2

## 2016-07-09 MED ORDER — LACTATED RINGERS IV BOLUS (SEPSIS)
1000.0000 mL | Freq: Once | INTRAVENOUS | Status: AC
  Administered 2016-07-09: 1000 mL via INTRAVENOUS

## 2016-07-09 NOTE — Consult Note (Signed)
Neurology Consult Note  Reason for Consultation: CODE STROKE   Requesting provider: Activated by EMS; ED MD Varney Biles, MD  CC: "I have pain all over."  HPI: This is a 60 year old right-handed woman who is brought to the emergency department by EMS as a code stroke. According to EMS, her family called 911 after they noticed the abrupt onset of slurred speech at 0100 this morning. The patient was apparently in bed when she asked her family to give her a bath because she thought she smelled bad. Family reported that as soon as they got her into the bathtub, they noticed that she had some speech changes and seemed to be confused. There is no report of any focal weakness or facial droop. EMS reports that they did not appreciate any lateralizing deficits on their assessment.  The patient was met on arrival in the emergency department. Her face was flushed and medics reported that she had been crying the entire way to the emergency department. She was taken for an emergent CT scan of the head which did not show any evidence of any acute abnormality. On examination, she was very anxious and emotionally labile, crying through much of the interview but then at times choking and laughing as well. The patient states that she needs to know what is wrong with her. When I ask her what problems she is having, she states that she has pain all over her body. She then asks if she can have a PET scan and when I asked her why she said that she was worried about "the C." She states that she has a history of uterine cancer and had a hysterectomy as a result. She also states that both of her parents died of cancer so she is now convinced that this is her underlying problem.  Medics were stated that they were told that the patient has a "fracture somewhere in the thoracic cavity." The patient endorses significant back pain. She states that she is presently not taking any medications, pain medications or otherwise. Notes  indicate that she has a history of chronic pain for which she has been taking pain medications in the past. The patient states that she stopped taking her depression medications about 3 months ago but does not state why.  She states that she was recently seen at [facility] for a seizure. On reviewing her medical record, there is documentation of the visit to the [facility] emergency department on 06/22/16. This note reports that she was riding in a car with some friends when she suddenly became unresponsive and she was noted to be shaking with foaming at the mouth. She was confused on arrival in the emergency department with evidence of incontinence and tongue biting. She reported that she had a prior history of seizures" though she has been told there are not from epilepsy and she is not currently on any anticonvulsants." During this visit, she was noted to have a wedge deformity of the T12 vertebral body consistent with compression fracture. According to the discharge medication list from this ED visit, she is supposed to be taking alprazolam 0.5 mg 3 times daily as needed, albuterol inhaler, duloxetine 120 mg daily, Flonase, Advair, lisinopril, Lyrica 150 mg each morning and 300 mg at bedtime, omeprazole, quetiapine 50-100 mg at bedtime, and Spiriva.  Last known well: 0100 NIH stroke score: 0 TPA given?: No, not consistent with stroke  PMH:  1. COPD 2. Hypertension 3. History of alcohol abuse 4. Osteoporosis  5. Chronic insomnia 6. Major depressive disorder 7. Anxiety 8. Fibromyalgia 9. Tobacco abuse  PSH:  Past Surgical History:  Procedure Laterality Date  . ABDOMINAL HYSTERECTOMY    . CHOLECYSTECTOMY    . collapsed lung    . TOTAL HIP ARTHROPLASTY      Family history: Her mother had bone cancer and her father had lung cancer.  Social history: She is divorced. She drinks 3 days per week on average and consumes a fifth of vodka each week. Her last drink was earlier  today. She smokes one half pack of cigarettes daily. She denies any illicit drug use.    Current inpatient meds:  No current facility-administered medications for this encounter.    Current Outpatient Prescriptions  Medication Sig Dispense Refill  . ALPRAZolam (XANAX) 0.5 MG tablet Take 0.5 mg by mouth at bedtime as needed.    Marland Kitchen amitriptyline (ELAVIL) 25 MG tablet Take 25 mg by mouth at bedtime.    Marland Kitchen buPROPion (ZYBAN) 150 MG 12 hr tablet Take 150 mg by mouth 2 (two) times daily.    . cyclobenzaprine (FLEXERIL) 10 MG tablet Take 10 mg by mouth 3 (three) times daily as needed.    Marland Kitchen escitalopram (LEXAPRO) 20 MG tablet Take 20 mg by mouth daily.    Marland Kitchen gabapentin (NEURONTIN) 300 MG capsule Take 300 mg by mouth 3 (three) times daily.    Marland Kitchen glycerin adult (GLYCERIN ADULT) 2 G SUPP Place 1 suppository rectally once as needed. For constipation.    Marland Kitchen omega-3 acid ethyl esters (LOVAZA) 1 G capsule Take 2 g by mouth 2 (two) times daily.    . ranitidine (ZANTAC) 150 MG tablet Take 150 mg by mouth 2 (two) times daily.      Allergies: Allergies  Allergen Reactions  . Other Swelling    Green peppers    ROS: As per HPI. A full 14-point review of systems was performed and is otherwise unremarkable.   PE:  BP 109/84   Pulse 109   Resp 22   Wt 71.2 kg (157 lb)   SpO2 95%   BMI 23.18 kg/m   General: WDWN who appears very anxious. She is intermittently tearful though at other times is laughing and joking during the examination and interview.   She is alert. She has an inconsistent pattern of speech with occasional stuttering over the initial syllables of words with hesitancy and occasional word finding difficulties. At other times, her speech is completely normal, particularly when she is joking around. She has no evidence of aphasia.  HEENT: Normocephalic. Neck supple without LAD. MMM, OP clear. Dentition good. Sclerae anicteric. No conjunctival injection.  CV: Regular, no murmur. Carotid pulses full  and symmetric, no bruits. Distal pulses 2+ and symmetric.  Lungs: CTAB.  Abdomen: Soft, non-distended, non-tender. Bowel sounds present x4.  Extremities: No C/C/E. Neuro:  CN: Pupils are equal and round. They are symmetrically reactive from 3-->2 mm. EOMI without nystagmus. No reported diplopia. Facial sensation is intact to light touch. Face is symmetric at rest with normal strength and mobility. Hearing is intact to conversational voice. Palate elevates symmetrically and uvula is midline. Voice is normal in tone, pitch and quality. Bilateral SCM and trapezii are 5/5. Tongue is midline with normal bulk and mobility.  Motor: Normal bulk, tone, and strength. No tremor or other abnormal movements. No drift.  Sensation: Intact to light touch, pinprick, vibration, and joint position.  DTRs: 2+, symmetric. Toes downgoing bilaterally. No pathologic reflexes.  Coordination: Finger-to-nose  and heel-to-shin are without dysmetria. Finger taps are normal in amplitude and speed, no decrement.     Labs:  Lab Results  Component Value Date   WBC 8.7 07/09/2016   HGB 16.0 (H) 07/09/2016   HCT 47.0 (H) 07/09/2016   PLT 163 07/09/2016   GLUCOSE 145 (H) 07/09/2016   ALT 62 (H) 05/29/2012   AST 44 (H) 05/29/2012   NA 134 (L) 07/09/2016   K 2.9 (L) 07/09/2016   CL 92 (L) 07/09/2016   CREATININE 0.80 07/09/2016   BUN 9 07/09/2016   CO2 27 05/29/2012   INR 0.90 07/09/2016   Troponin 0.00  Serum alcohol 252 mg/dL   Imaging: I have personally and independently reviewed the CT scan of the head without contrast from today. This shows no evidence of acute abnormality. There is mild chronic small vessel ischemic change and mild diffuse generalized atrophy.   Assessment and Plan:  1. Acute encephalopathy: The patient is acutely intoxicated with serum alcohol 252 mg/dL. She has no evidence of acute stroke or any other primary neurologic disorder at this time. Code stroke has been canceled. Anticipate her  symptoms will improve once she sobers up. No further neurologic workup is indicated at present.  At this time, I have no further recommendations and will sign off. Please call if there are any additional questions or concerns.

## 2016-07-09 NOTE — ED Notes (Signed)
Allowed to eat per MD

## 2016-07-09 NOTE — ED Notes (Signed)
Called [employer] to notify patient is back from MRI.

## 2016-07-09 NOTE — ED Provider Notes (Signed)
Patient discussed with, and care assumed from Dr. Rhunette CroftNanavati.  Patient presents intoxicated with slurred speech. She had a period of time where she felt like she was having difficulty expressing herself. She has no signs on my examination of stroke. She has no pronator drift or leg drift. She is slow with her speech she states she feels better. I've examined her after 5 hours when I would expect her alcohol to be less than 150. I did perform an MRI of her brain which shows no sign of acute stroke. Her main complaint is that she has back pain. She is noted to have had a recent first-time seizure and with her fall had a T12 compression fracture. [facility]. A TLSO brace has been ordered and fitted. She's been given pain control. Given her neurosurgery for follow-up to discuss kyphoplasty of her pain persists. Given her a prescription for pain medication for home. She is fairly adamant, and I tend to believe her, that she has been drinking the cause of pain. She states that she does not get withdrawal symptoms. I did discuss with her the recent seizure could have been a withdrawal seizure. However she states that she will go days or weeks without drinking and has never had shakes and withdrawal symptoms, at least that she confesses 2.  She was able to transfer with the brace. Family is comfortable with her at home. She was discharged home.   Rolland PorterMark Duy Lemming, MD 07/09/16 [phone]

## 2016-07-09 NOTE — Discharge Instructions (Signed)
You have a copression fracture in your thoracic spine (T12).  Wear Brace for pain control.    Avoid upright activity.  Pain medication.    Stool softener to avoid constipation.  Call Dr. Yetta BarreJones for appointment.  No alcohol while taking pain medication.

## 2016-07-09 NOTE — Progress Notes (Signed)
This is a 60 year old female from [facility]. Family called for new onset slurred speech and confusion starting at 0100. Pt was in the bed when she called family wanting a bath because she "smelled bad." Pt reported "all over body pain." Pt had been tearful in route and has similar episodes during assessment. Pt was flushed in the face upon arrival to ED. CBG 172, NIHSS 0. Code stroke cancelled. Pt's ETOH is 252 mg/dl. Pt reports she drinks a 5th of Vodka a week. Last drank prior to going to bed last pm. No further neuro consults needed at this time.

## 2016-07-09 NOTE — ED Provider Notes (Signed)
[facility]Y DEPT Provider Note   CSN: [id number] Arrival date & time: 07/09/16  0422     History   Chief Complaint Chief Complaint  Patient presents with  . Code Stroke    HPI Julia Simpson is a 60 y.o. female.  HPI  60 year old F who is brought to the emergency department by EMS as a code stroke. According to EMS, family called 911 after they noticed the abrupt onset of slurred speech at 0100 this morning. Family reported that as soon as they got her into the bathtub, they noticed that she had some speech changes and seemed to be confused.  Pt has hx of COPD, Stroke, HTN. She denies any weakness, numbness, vision changes, dizziness. Pt reports that she has a hard time getting some of the words out right now. She thinks she has too much stress, due to  At least one of her daughters.  Past Medical History:  Diagnosis Date  . COPD (chronic obstructive pulmonary disease) (HCC)   . Hypertension   . Stroke [facility])     There are no active problems to display for this patient.   Past Surgical History:  Procedure Laterality Date  . ABDOMINAL HYSTERECTOMY    . CHOLECYSTECTOMY    . collapsed lung    . TOTAL HIP ARTHROPLASTY      OB History    No data available       Home Medications    Prior to Admission medications   Medication Sig Start Date End Date Taking? Authorizing Provider  ADVAIR DISKUS 250-50 MCG/DOSE AEPB Inhale 1 puff into the lungs 2 (two) times daily. 06/15/16  Yes Historical Provider, MD  albuterol (PROVENTIL) (5 MG/ML) 0.5% nebulizer solution Inhale 2.5 mg into the lungs every 6 (six) hours as needed for wheezing. 06/15/16  Yes Historical Provider, MD  folic acid (FOLVITE) 1 MG tablet Take 1 mg by mouth daily. 06/15/16  Yes Historical Provider, MD  PROAIR HFA 108 (90 Base) MCG/ACT inhaler Inhale 2 puffs into the lungs every 6 (six) hours as needed for wheezing. 06/15/16  Yes Historical Provider, MD  thiamine 100 MG tablet Take 100 mg by mouth daily.  06/15/16 06/15/17 Yes Historical Provider, MD  docusate sodium (COLACE) 100 MG capsule Take 1 capsule (100 mg total) by mouth every 12 (twelve) hours. 07/09/16   Rolland Porter, MD  ondansetron (ZOFRAN ODT) 4 MG disintegrating tablet Take 1 tablet (4 mg total) by mouth every 8 (eight) hours as needed for nausea. 07/09/16   Rolland Porter, MD  oxyCODONE-acetaminophen (PERCOCET/ROXICET) 5-325 MG tablet Take 2 tablets by mouth every 4 (four) hours as needed. 07/09/16   Rolland Porter, MD    Family History No family history on file.  Social History Social History  Substance Use Topics  . Smoking status: Current Every Day Smoker  . Smokeless tobacco: Not on file  . Alcohol use No     Allergies   Bee venom and Other   Review of Systems Review of Systems  ROS 10 Systems reviewed and are negative for acute change except as noted in the HPI.     Physical Exam Updated Vital Signs BP 119/85   Pulse 106   Resp 12   Ht 5\' 8"  (1.727 m)   Wt 157 lb (71.2 kg)   SpO2 95%   BMI 23.87 kg/m   Physical Exam  Constitutional: She is oriented to person, place, and time. She appears well-developed.  HENT:  Head: Normocephalic and atraumatic.  Eyes:  Conjunctivae and EOM are normal. Pupils are equal, round, and reactive to light.  Neck: Normal range of motion. Neck supple.  Cardiovascular: Normal rate, regular rhythm and normal heart sounds.   Pulmonary/Chest: Effort normal and breath sounds normal. No respiratory distress.  Abdominal: Soft. Bowel sounds are normal. She exhibits no distension. There is no tenderness. There is no rebound and no guarding.  Neurological: She is alert and oriented to person, place, and time. No cranial nerve deficit. Coordination normal.  Pressured speech  Skin: Skin is warm and dry.  Nursing note and vitals reviewed.    ED Treatments / Results  Labs (all labs ordered are listed, but only abnormal results are displayed) Labs Reviewed  ETHANOL - Abnormal; Notable for  the following:       Result Value   Alcohol, Ethyl (B) 252 (*)    All other components within normal limits  CBC - Abnormal; Notable for the following:    MCV 100.5 (*)    MCH 35.1 (*)    All other components within normal limits  DIFFERENTIAL - Abnormal; Notable for the following:    Lymphs Abs 5.9 (*)    All other components within normal limits  COMPREHENSIVE METABOLIC PANEL - Abnormal; Notable for the following:    Sodium 133 (*)    Potassium 2.9 (*)    Chloride 93 (*)    Glucose, Bld 142 (*)    AST 65 (*)    Alkaline Phosphatase 174 (*)    All other components within normal limits  RAPID URINE DRUG SCREEN, HOSP PERFORMED - Abnormal; Notable for the following:    Benzodiazepines POSITIVE (*)    All other components within normal limits  URINALYSIS, ROUTINE W REFLEX MICROSCOPIC (NOT AT [facility]) - Abnormal; Notable for the following:    Ketones, ur 15 (*)    Nitrite POSITIVE (*)    All other components within normal limits  URINE MICROSCOPIC-ADD ON - Abnormal; Notable for the following:    Squamous Epithelial / LPF 6-30 (*)    Bacteria, UA FEW (*)    Casts HYALINE CASTS (*)    All other components within normal limits  I-STAT CHEM 8, ED - Abnormal; Notable for the following:    Sodium 134 (*)    Potassium 2.9 (*)    Chloride 92 (*)    Glucose, Bld 145 (*)    Calcium, Ion 1.02 (*)    Hemoglobin 16.0 (*)    HCT 47.0 (*)    All other components within normal limits  CBG MONITORING, ED - Abnormal; Notable for the following:    Glucose-Capillary 153 (*)    All other components within normal limits  PROTIME-INR  APTT  I-STAT TROPOININ, ED    EKG  EKG Interpretation  Date/Time:  Wednesday July 09 2016 04:41:06 EDT Ventricular Rate:  117 PR Interval:    QRS Duration: 71 QT Interval:  335 QTC Calculation: 468 R Axis:   85 Text Interpretation:  Sinus tachycardia normal axis and intervals  No acute changes Confirmed by Rhunette Croft, MD, Janey Genta [phone]) on 07/09/2016 6:29:38  AM       Radiology Mr Brain W Wo Contrast  Result Date: 07/09/2016 CLINICAL DATA:  Aphasia.  Code stroke. EXAM: MRI HEAD WITHOUT AND WITH CONTRAST TECHNIQUE: Multiplanar, multiecho pulse sequences of the brain and surrounding structures were obtained without and with intravenous contrast. CONTRAST:  15mL MULTIHANCE GADOBENATE DIMEGLUMINE 529 MG/ML IV SOLN COMPARISON:  CT head 07/09/2016 FINDINGS: Brain: Ventricle size normal.  Mild cortical atrophy. Negative for acute infarct. Small white matter hyperintensities in the deep white matter bilaterally most consistent with chronic microvascular ischemia. Basal ganglia, brainstem, cerebellum normal. Negative for hemorrhage or mass Normal enhancement postcontrast administration. No enhancing mass lesion. Leptomeningeal enhancement normal. Vascular: Normal arterial flow voids.  Normal venous enhancement. Skull and upper cervical spine: Negative Sinuses/Orbits: Negative Other: None IMPRESSION: No acute intracranial abnormality. Mild atrophy and mild chronic microvascular ischemic change in the white matter Electronically Signed   By: Marlan Palauharles  Clark M.D.   On: 07/09/2016 11:17    Procedures Procedures (including critical care time)  Medications Ordered in ED Medications  midazolam (VERSED) injection 1 mg (1 mg Intravenous Given 07/09/16 0537)  thiamine (VITAMIN B-1) tablet 100 mg (100 mg Oral Given 07/09/16 1213)  potassium chloride SA (K-DUR,KLOR-CON) CR tablet 40 mEq (40 mEq Oral Given 07/09/16 1213)  magnesium oxide (MAG-OX) tablet 400 mg (400 mg Oral Given 07/09/16 1213)  lactated ringers bolus 1,000 mL (0 mLs Intravenous Stopped 07/09/16 0826)  morphine 4 MG/ML injection 4 mg (4 mg Intravenous Given 07/09/16 1211)  ondansetron (ZOFRAN) injection 4 mg (4 mg Intravenous Given 07/09/16 0820)  gadobenate dimeglumine (MULTIHANCE) injection 15 mL (15 mLs Intravenous Contrast Given 07/09/16 1115)  ondansetron (ZOFRAN) injection 4 mg (4 mg Intravenous  Given 07/09/16 1250)  diphenhydrAMINE (BENADRYL) injection 12.5 mg (12.5 mg Intravenous Given 07/09/16 1350)  promethazine (PHENERGAN) injection 12.5 mg (12.5 mg Intravenous Given 07/09/16 1350)     Initial Impression / Assessment and Plan / ED Course  I have reviewed the triage vital signs and the nursing notes.  Pertinent labs & imaging results that were available during my care of the patient were reviewed by me and considered in my medical decision making (see chart for details).  Clinical Course    Pt comes in with cc of code stroke. She reportedly has slurred speech - maybe expressive aphasia. However, once patient is able to start speaking, she is able to complete the chain of thought well. Ct head is neg. ETOH level just returned + - 252. I think the etoh is the cause for her symptoms. Will replace the K.  Final Clinical Impressions(s) / ED Diagnoses   Final diagnoses:  Alcoholic intoxication without complication (HCC)  Hypokalemia  Acute bilateral low back pain without sciatica  Closed compression fracture of thoracic vertebra with delayed healing, subsequent encounter    New Prescriptions Discharge Medication List as of 07/09/2016  1:14 PM    START taking these medications   Details  docusate sodium (COLACE) 100 MG capsule Take 1 capsule (100 mg total) by mouth every 12 (twelve) hours., Starting Wed 07/09/2016, Print    ondansetron (ZOFRAN ODT) 4 MG disintegrating tablet Take 1 tablet (4 mg total) by mouth every 8 (eight) hours as needed for nausea., Starting Wed 07/09/2016, Print    oxyCODONE-acetaminophen (PERCOCET/ROXICET) 5-325 MG tablet Take 2 tablets by mouth every 4 (four) hours as needed., Starting Wed 07/09/2016, Print         Derwood KaplanAnkit Shay Bartoli, MD 07/11/16 [phone]

## 2016-07-09 NOTE — ED Notes (Signed)
Patient transported to MRI 

## 2016-07-09 NOTE — ED Triage Notes (Signed)
Pt arrived via [facility] after family witnessed slurred speech at 0100. Pt has hx of cva. [facility] and appears anxious. Pt has no weakness or facial droop. Pt does appear to be having difficulty forming sentences. Pt is complaining of "pain all over" and requesting PET scan. Pt reports not taking any medications at this time. She quit taking depression medication. Pt admits to ETOH earlier today

## 2016-07-09 NOTE — ED Notes (Signed)
Spoke to [employer] regarding TSLO brace. Ortho tech to call [facility] when open at 9am.
